# Patient Record
Sex: Male | Born: 1955 | Race: Black or African American | Hispanic: No | Marital: Single | Smoking: Current every day smoker
Health system: Southern US, Community
[De-identification: ages and names within clinical notes are randomized; demographics above are authoritative.]

## PROBLEM LIST (undated history)

## (undated) DIAGNOSIS — M79675 Pain in left toe(s): Secondary | ICD-10-CM

## (undated) DIAGNOSIS — E7800 Pure hypercholesterolemia, unspecified: Secondary | ICD-10-CM

## (undated) DIAGNOSIS — B351 Tinea unguium: Secondary | ICD-10-CM

## (undated) HISTORY — DX: Tinea unguium: B35.1

## (undated) HISTORY — DX: Tinea unguium: M79.675

## (undated) HISTORY — DX: Pure hypercholesterolemia, unspecified: E78.00

---
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
## 2000-06-23 ENCOUNTER — Encounter: Payer: Self-pay | Admitting: Neurology

## 2000-06-23 ENCOUNTER — Ambulatory Visit (HOSPITAL_COMMUNITY): Admission: RE | Admit: 2000-06-23 | Discharge: 2000-06-23 | Payer: Self-pay | Admitting: Neurology

## 2000-08-18 ENCOUNTER — Inpatient Hospital Stay (HOSPITAL_COMMUNITY): Admission: EM | Admit: 2000-08-18 | Discharge: 2000-09-02 | Payer: Self-pay | Admitting: Emergency Medicine

## 2000-08-18 ENCOUNTER — Encounter: Payer: Self-pay | Admitting: Emergency Medicine

## 2000-08-19 ENCOUNTER — Encounter: Admission: RE | Admit: 2000-08-19 | Discharge: 2000-08-19 | Payer: Self-pay

## 2000-12-19 ENCOUNTER — Ambulatory Visit (HOSPITAL_COMMUNITY): Admission: RE | Admit: 2000-12-19 | Discharge: 2000-12-19 | Payer: Self-pay | Admitting: Neurology

## 2001-08-17 ENCOUNTER — Ambulatory Visit (HOSPITAL_COMMUNITY): Admission: RE | Admit: 2001-08-17 | Discharge: 2001-08-17 | Payer: Self-pay | Admitting: Neurology

## 2003-01-10 ENCOUNTER — Encounter: Payer: Self-pay | Admitting: Emergency Medicine

## 2003-01-10 ENCOUNTER — Inpatient Hospital Stay (HOSPITAL_COMMUNITY): Admission: EM | Admit: 2003-01-10 | Discharge: 2003-01-15 | Payer: Self-pay | Admitting: Emergency Medicine

## 2019-05-18 DIAGNOSIS — F17200 Nicotine dependence, unspecified, uncomplicated: Secondary | ICD-10-CM | POA: Insufficient documentation

## 2019-05-18 DIAGNOSIS — R7302 Impaired glucose tolerance (oral): Secondary | ICD-10-CM | POA: Insufficient documentation

## 2019-05-18 DIAGNOSIS — E785 Hyperlipidemia, unspecified: Secondary | ICD-10-CM | POA: Insufficient documentation

## 2019-05-25 ENCOUNTER — Ambulatory Visit: Payer: Self-pay | Admitting: Podiatry

## 2019-06-01 ENCOUNTER — Ambulatory Visit (INDEPENDENT_AMBULATORY_CARE_PROVIDER_SITE_OTHER): Payer: Medicaid Other | Admitting: Podiatry

## 2019-06-01 ENCOUNTER — Other Ambulatory Visit: Payer: Self-pay

## 2019-06-01 ENCOUNTER — Encounter: Payer: Self-pay | Admitting: Podiatry

## 2019-06-01 VITALS — BP 120/64 | HR 59 | Temp 97.9°F

## 2019-06-01 DIAGNOSIS — M79674 Pain in right toe(s): Secondary | ICD-10-CM | POA: Diagnosis not present

## 2019-06-01 DIAGNOSIS — B351 Tinea unguium: Secondary | ICD-10-CM | POA: Insufficient documentation

## 2019-06-01 DIAGNOSIS — M79675 Pain in left toe(s): Secondary | ICD-10-CM | POA: Diagnosis not present

## 2019-06-01 HISTORY — DX: Tinea unguium: B35.1

## 2019-06-01 NOTE — Patient Instructions (Signed)

## 2019-06-03 ENCOUNTER — Encounter: Payer: Self-pay | Admitting: Podiatry

## 2019-06-03 NOTE — Progress Notes (Signed)
Subjective: Lawrence Robertson presents today referred bywith cc of painful, discolored, thick toenails greater than several months which interfere with daily activities.  Pain is aggravated when wearing enclosed shoe gear.   Past Medical History:  Diagnosis Date  . Hypercholesterolemia   . Onychomycosis 06/01/2019     History reviewed. No pertinent surgical history.    Current Outpatient Medications:  .  simvastatin (ZOCOR) 20 MG tablet, TK 1 T PO  D IN THE EVENING, Disp: , Rfl:  .  hydrochlorothiazide (HYDRODIURIL) 25 MG tablet, , Disp: , Rfl:  .  triamcinolone ointment (KENALOG) 0.5 %, , Disp: , Rfl:    No Known Allergies   Social History   Occupational History  . Not on file  Tobacco Use  . Smoking status: Current Every Day Smoker    Packs/day: 0.25  . Smokeless tobacco: Never Used  Substance and Sexual Activity  . Alcohol use: Not on file  . Drug use: Not on file  . Sexual activity: Not on file     History reviewed. No pertinent family history.    There is no immunization history on file for this patient.   Review of systems: Positive Findings in bold print.  Constitutional:  chills, fatigue, fever, sweats, weight change Communication: Optometrist, sign Ecologist, hand writing, iPad/Android device Head: headaches, head injury Eyes: changes in vision, eye pain, glaucoma, cataracts, macular degeneration, diplopia, glare,  light sensitivity, eyeglasses or contacts, blindness Ears nose mouth throat: hearing impaired, hearing aids,  ringing in ears, deaf, sign language,  vertigo,   nosebleeds,  rhinitis,  cold sores, snoring, swollen glands Cardiovascular: HTN, edema, arrhythmia, pacemaker in place, defibrillator in place, chest pain/tightness, chronic anticoagulation, blood clot, heart failure, MI Peripheral Vascular: leg cramps, varicose veins, blood clots, lymphedema, varicosities Respiratory:  difficulty breathing, denies congestion, SOB, wheezing, cough,  emphysema Gastrointestinal: change in appetite or weight, abdominal pain, constipation, diarrhea, nausea, vomiting, vomiting blood, change in bowel habits, abdominal pain, jaundice, rectal bleeding, hemorrhoids, GERD Genitourinary:  nocturia,  pain on urination, polyuria,  blood in urine, Foley catheter, urinary urgency, ESRD on hemodialysis Musculoskeletal: amputation, cramping, stiff joints, painful joints, decreased joint motion, fractures, OA, gout, hemiplegia, paraplegia, uses cane, wheelchair bound, uses walker, uses rollator Skin: +changes in toenails, color change, dryness, itching, mole changes,  rash, wound(s) Neurological: headaches, numbness in feet, paresthesias in feet, burning in feet, fainting,  seizures, change in speech. denies headaches, memory problems/poor historian, cerebral palsy, weakness, paralysis, CVA, TIA Endocrine: diabetes, hypothyroidism, hyperthyroidism,  goiter, dry mouth, flushing, heat intolerance,  cold intolerance,  excessive thirst, denies polyuria,  nocturia Hematological:  easy bleeding, excessive bleeding, easy bruising, enlarged lymph nodes, on long term blood thinner, history of past transusions Allergy/immunological:  hives, eczema, frequent infections, multiple drug allergies, seasonal allergies, transplant recipient, multiple food allergies Psychiatric:  anxiety, depression, mood disorder, suicidal ideations, hallucinations, insomnia  Objective: Vitals:   06/01/19 0857  BP: 120/64  Pulse: (!) 59  Temp: 97.9 F (36.6 C)    Vascular Examination: Capillary refill time <3 seconds x 10 digits.  Dorsalis pedis pulses 2/4 b/l.   Posterior tibial pulses 2/4 b/l.   Sparse digital hair x 10 digits.  Skin temperature gradient WNL b/l.  Dermatological Examination: Skin with normal turgor, texture and tone b/l.  Toenails 2-5 b/l discolored, thick, dystrophic with subungual debris and pain with palpation to nailbeds due to thickness of  nails.  Onychogryphosis b/l great toes with toenails overgrown and growing in a lateral direction overlying  the dorsal aspect of both 2nd digits.  There is pain with palpation to nailbeds due to thickness of nails.  Hyperkeratotic lesion submet head 1 left foot and hallux right foot with tenderness to palpation. No edema, no erythema, no drainage, no flocculence.  Musculoskeletal: Muscle strength 5/5 to all LE muscle groups.  Neurological: Sensation intact 5/5 b/l with 10 gram monofilament.  Vibratory sensation intact b/l.  Assessment: 1. Painful onychomycosis toenails 1-5 b/l   Plan: 1. Discussed onychomycosis and treatment options.  Literature dispensed on today. 2. Toenails 1-5 b/l were debrided in length and girth without iatrogenic bleeding. 3. Advised patient to use pumice stone on calluses once weekly for maintenance. 4. Patient to continue soft, supportive shoe gear daily. 5. Patient to report any pedal injuries to medical professional immediately. 6. Follow up 3 months.  7. Patient/POA to call should there be a concern in the interim.

## 2019-08-31 ENCOUNTER — Ambulatory Visit: Payer: Medicaid Other | Admitting: Podiatry

## 2019-09-04 ENCOUNTER — Ambulatory Visit: Payer: Medicaid Other | Admitting: Podiatry

## 2020-07-18 DIAGNOSIS — M25569 Pain in unspecified knee: Secondary | ICD-10-CM | POA: Insufficient documentation

## 2020-08-18 ENCOUNTER — Ambulatory Visit: Payer: Medicaid Other | Admitting: Podiatry

## 2020-11-21 ENCOUNTER — Encounter: Payer: Self-pay | Admitting: Podiatry

## 2020-11-21 ENCOUNTER — Other Ambulatory Visit: Payer: Self-pay

## 2020-11-21 ENCOUNTER — Ambulatory Visit (INDEPENDENT_AMBULATORY_CARE_PROVIDER_SITE_OTHER): Payer: Medicaid Other | Admitting: Podiatry

## 2020-11-21 DIAGNOSIS — B351 Tinea unguium: Secondary | ICD-10-CM

## 2020-11-21 DIAGNOSIS — M79675 Pain in left toe(s): Secondary | ICD-10-CM | POA: Diagnosis not present

## 2020-11-21 DIAGNOSIS — L602 Onychogryphosis: Secondary | ICD-10-CM | POA: Diagnosis not present

## 2020-11-21 DIAGNOSIS — M79674 Pain in right toe(s): Secondary | ICD-10-CM | POA: Diagnosis not present

## 2020-11-21 NOTE — Progress Notes (Signed)
  Subjective:  Patient ID: Lawrence Robertson, male    DOB: 1956/06/26,  MRN: [id number]  Lawrence Robertson presents to clinic today for painful thick toenails that are difficult to trim. Pain interferes with ambulation. Aggravating factors include wearing enclosed shoe gear. Pain is relieved with periodic professional debridement.   He states both great toes are painful on today's visit. It has been over a year since his last visit to our clinic.  Review of Systems: Negative except as noted in the HPI. Past Medical History:  Diagnosis Date  . Hypercholesterolemia   . Onychomycosis 06/01/2019   History reviewed. No pertinent surgical history.  Current Outpatient Medications:  .  atorvastatin (LIPITOR) 40 MG tablet, Take 40 mg by mouth at bedtime., Disp: , Rfl:  .  diclofenac Sodium (VOLTAREN) 1 % GEL, Apply 4 g topically 4 (four) times daily., Disp: , Rfl:  .  hydrochlorothiazide (HYDRODIURIL) 25 MG tablet, , Disp: , Rfl:  .  simvastatin (ZOCOR) 20 MG tablet, TK 1 T PO  D IN THE EVENING (Patient not taking: Reported on 11/21/2020), Disp: , Rfl:  .  triamcinolone ointment (KENALOG) 0.5 %, , Disp: , Rfl:  No Known Allergies Social History   Occupational History  . Not on file  Tobacco Use  . Smoking status: Current Every Day Smoker    Packs/day: 0.25  . Smokeless tobacco: Never Used  Substance and Sexual Activity  . Alcohol use: Not on file  . Drug use: Not on file  . Sexual activity: Not on file    Objective:   Constitutional Lawrence Robertson is a pleasant 65 y.o. Caucasian male, WD, WN in NAD. AAO x 3.   Vascular Capillary fill time to digits <3 seconds b/l lower extremities. Palpable pedal pulses b/l LE. Pedal hair sparse. Lower extremity skin temperature gradient within normal limits. No cyanosis or clubbing noted.  Neurologic Normal speech. Oriented to person, place, and time. Protective sensation intact 5/5 intact bilaterally with 10g monofilament b/l. Vibratory sensation intact b/l.   Dermatologic Pedal skin with normal turgor, texture and tone bilaterally. No open wounds bilaterally. No interdigital macerations bilaterally. Toenails 1-5 b/l elongated, discolored, dystrophic, thickened, crumbly with subungual debris and tenderness to dorsal palpation. Onychogryphosis of b/l great toes with tenderness to palpation. Nailplates growing in dorsolateral direction. No erythema, no edema, no drainage, no fluctuance. No hyperkeratotic nor porokeratotic lesions present on today's visit.  Orthopedic: Normal muscle strength 5/5 to all lower extremity muscle groups bilaterally. No pain crepitus or joint limitation noted with ROM b/l. Hallux valgus with bunion deformity noted b/l lower extremities. Patient ambulates independent of any assistive aids.   Radiographs: None Assessment:   1. Pain due to onychomycosis of toenails of both feet   2. Onychogryphosis   3. Pain in toes of both feet    Plan:  Patient was evaluated and treated and all questions answered.  Onychomycosis with pain -Nails palliatively debridement as below -Educated on self-care  Procedure: Nail Debridement Rationale: Pain Type of Debridement: manual, sharp debridement. Instrumentation: Nail nipper, rotary burr. Number of Nails: 10 -Examined patient. -No new findings. No new orders. -Patient to continue soft, supportive shoe gear daily. -Toenails 1-5 b/l were debrided in length and girth with sterile nail nippers and dremel without iatrogenic bleeding.  -Patient to report any pedal injuries to medical professional immediately. -Patient/POA to call should there be question/concern in the interim.  Return in about 3 months (around 02/19/2021).  Marzetta Board, DPM

## 2021-03-06 ENCOUNTER — Other Ambulatory Visit: Payer: Self-pay

## 2021-03-06 ENCOUNTER — Ambulatory Visit: Payer: Medicaid Other | Admitting: Podiatry

## 2021-03-06 ENCOUNTER — Encounter: Payer: Self-pay | Admitting: Podiatry

## 2021-03-06 DIAGNOSIS — M79674 Pain in right toe(s): Secondary | ICD-10-CM

## 2021-03-06 DIAGNOSIS — M79675 Pain in left toe(s): Secondary | ICD-10-CM | POA: Diagnosis not present

## 2021-03-06 DIAGNOSIS — B351 Tinea unguium: Secondary | ICD-10-CM

## 2021-03-06 NOTE — Progress Notes (Signed)
Subjective: Lawrence Robertson is a pleasant 65 y.o. male patient seen today painful thick toenails that are difficult to trim. Pain interferes with ambulation. Aggravating factors include wearing enclosed shoe gear. Pain is relieved with periodic professional debridement.  He is accompanied by his sister on today's visit.  They voice no new pedal problems on today's visit.  No Known Allergies  Objective: Physical Exam  General: Lawrence Robertson is a pleasant 65 y.o. African American male, in NAD. AAO x 3.   Vascular:  Capillary refill time to digits <3 seconds b/l. Palpable pedal pulses b/l LE. Pedal hair sparse b/l lower extremities. Lower extremity skin temperature gradient within normal limits. No pain with calf compression b/l. No edema noted b/l lower extremities.   Dermatological:  Pedal skin with normal turgor, texture and tone bilaterally. No open wounds bilaterally. No interdigital macerations bilaterally. Toenails 1-5 b/l elongated, discolored, dystrophic, thickened, crumbly with subungual debris and tenderness to dorsal palpation. No hyperkeratotic nor porokeratotic lesions present on today's visit.  Musculoskeletal:  Normal muscle strength 5/5 to all lower extremity muscle groups bilaterally. No pain crepitus or joint limitation noted with ROM b/l. Hallux valgus with bunion deformity noted b/l lower extremities. Patient ambulates independent of any assistive aids.  Neurological:  Protective sensation intact 5/5 intact bilaterally with 10g monofilament b/l. Vibratory sensation intact b/l. Clonus negative b/l.  Assessment and Plan:  1. Pain due to onychomycosis of toenails of both feet     -Examined patient. -No new findings. No new orders. -Patient to continue soft, supportive shoe gear daily. -Toenails 1-5 b/l were debrided in length and girth with sterile nail nippers and dremel without iatrogenic bleeding.  -Patient to report any pedal injuries to medical professional  immediately. -Patient/POA to call should there be question/concern in the interim.  Return in about 3 months (around 06/06/2021).  Marzetta Board, DPM

## 2021-06-12 ENCOUNTER — Other Ambulatory Visit: Payer: Self-pay

## 2021-06-12 ENCOUNTER — Ambulatory Visit (INDEPENDENT_AMBULATORY_CARE_PROVIDER_SITE_OTHER): Payer: Medicaid Other | Admitting: Podiatry

## 2021-06-12 ENCOUNTER — Encounter: Payer: Self-pay | Admitting: Podiatry

## 2021-06-12 DIAGNOSIS — M79675 Pain in left toe(s): Secondary | ICD-10-CM

## 2021-06-12 DIAGNOSIS — B351 Tinea unguium: Secondary | ICD-10-CM | POA: Diagnosis not present

## 2021-06-12 DIAGNOSIS — M79674 Pain in right toe(s): Secondary | ICD-10-CM | POA: Diagnosis not present

## 2021-06-14 NOTE — Progress Notes (Signed)
Subjective: Lawrence Robertson is a pleasant 65 y.o. male patient seen today for painful thick toenails that are difficult to trim. Pain interferes with ambulation. Aggravating factors include wearing enclosed shoe gear. Pain is relieved with periodic professional debridement.   His sister is present during today's visit. They voice no new pedal concerns on today'  No Known Allergies  Objective: Physical Exam  General: Lawrence Robertson is a pleasant 65 y.o. African American male, WD, WN in NAD. AAO x 3.   Vascular:  Capillary fill time to digits <3 seconds b/l lower extremities. Palpable DP pulse(s) b/l lower extremities Palpable PT pulse(s) b/l lower extremities Pedal hair sparse. Lower extremity skin temperature gradient within normal limits. No pain with calf compression b/l. No edema noted b/l lower extremities.  Dermatological:  Pedal skin with normal turgor, texture and tone b/l lower extremities. No open wounds b/l lower extremities. No interdigital macerations b/l lower extremities. Toenails 1-5 b/l elongated, discolored, dystrophic, thickened, crumbly with subungual debris and tenderness to dorsal palpation.  Musculoskeletal:  Normal muscle strength 5/5 to all lower extremity muscle groups bilaterally. No pain crepitus or joint limitation noted with ROM b/l lower extremities. Hallux valgus with bunion deformity noted b/l lower extremities. Patient ambulates independent of any assistive aids.  Neurological:  Protective sensation intact 5/5 intact bilaterally with 10g monofilament b/l. Vibratory sensation intact b/l. Proprioception intact bilaterally.  Assessment and Plan:  1. Pain due to onychomycosis of toenails of both feet      -Patient to continue soft, supportive shoe gear daily. -Toenails 1-5 b/l were debrided in length and girth with sterile nail nippers and dremel without iatrogenic bleeding.  -Patient to report any pedal injuries to medical professional  immediately. -Patient/POA to call should there be question/concern in the interim.  Return in about 3 months (around 09/12/2021).  Marzetta Board, DPM

## 2021-08-07 DIAGNOSIS — Z720 Tobacco use: Secondary | ICD-10-CM | POA: Diagnosis not present

## 2021-08-07 DIAGNOSIS — Z7251 High risk heterosexual behavior: Secondary | ICD-10-CM | POA: Diagnosis not present

## 2021-08-07 DIAGNOSIS — E785 Hyperlipidemia, unspecified: Secondary | ICD-10-CM | POA: Diagnosis not present

## 2021-08-07 DIAGNOSIS — Z125 Encounter for screening for malignant neoplasm of prostate: Secondary | ICD-10-CM | POA: Diagnosis not present

## 2021-08-07 DIAGNOSIS — Z0000 Encounter for general adult medical examination without abnormal findings: Secondary | ICD-10-CM | POA: Diagnosis not present

## 2021-08-19 ENCOUNTER — Other Ambulatory Visit: Payer: Self-pay | Admitting: Nurse Practitioner

## 2021-08-19 DIAGNOSIS — Z720 Tobacco use: Secondary | ICD-10-CM

## 2021-08-21 ENCOUNTER — Other Ambulatory Visit: Payer: Self-pay

## 2021-08-21 ENCOUNTER — Ambulatory Visit (INDEPENDENT_AMBULATORY_CARE_PROVIDER_SITE_OTHER): Payer: Self-pay | Admitting: Internal Medicine

## 2021-08-21 ENCOUNTER — Other Ambulatory Visit (HOSPITAL_COMMUNITY)
Admission: RE | Admit: 2021-08-21 | Discharge: 2021-08-21 | Disposition: A | Discharge status: Home / Self Care | Payer: Medicaid Other | Source: Ambulatory Visit | Attending: Internal Medicine | Admitting: Internal Medicine

## 2021-08-21 ENCOUNTER — Encounter: Payer: Self-pay | Admitting: Internal Medicine

## 2021-08-21 VITALS — BP 162/88 | HR 46 | Temp 98.2°F | Wt 176.0 lb

## 2021-08-21 DIAGNOSIS — A539 Syphilis, unspecified: Secondary | ICD-10-CM

## 2021-08-21 NOTE — Progress Notes (Signed)
Patient: Lawrence Robertson  DOB: 06/30/1956 MRN: [id number] PCP: No primary care provider on file.    Chief Complaint  Patient presents with   New Patient (Initial Visit)    Syphilis      Patient Active Problem List   Diagnosis Date Noted   Onychomycosis 06/01/2019     Subjective:  Lawrence Robertson is a 65 y.o. Male with chronic knee pain, HLD, tobacco abuse presents for syphilis referred by PCP. He was seen by Maxwell Marion, NP at [facility] as was tested for syphilis as part of routine screening. RPR returned 1:2, pt started on bicillin 2.4 MU weekly x 3 weeks for late latent syphilis and referred to ID for further management. Sister is present during the visit and provides much of the history. She reprots about 10-15 years ago(cannot recall exactly) he was hospitalized for syphilis. 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 She is thinks he may have had a non-reactive syphilis test. Pt has not been sexually active for 22 years. He denies rash, fever, chills, HA, blurry vision, N,V,D.  Labs: Rpr 1:2 on 08/07/21 Hiv ab/p24 negative on  08/07/21  Review of Systems  Constitutional:  Negative for chills and fever.  HENT:  Negative for congestion, hearing loss, sinus pain and tinnitus.   Eyes:  Negative for blurred vision and double vision.  Respiratory: Negative.    Cardiovascular: Negative.   Gastrointestinal: Negative.   Genitourinary: Negative.   Musculoskeletal: Negative.   Skin: Negative.   Neurological:  Negative for headaches.  Endo/Heme/Allergies: Negative.   Psychiatric/Behavioral: Negative.     Past Medical History:  Diagnosis Date   Hypercholesterolemia    Onychomycosis 06/01/2019    Outpatient Medications Prior to Visit  Medication Sig Dispense Refill   atorvastatin (LIPITOR) 40 MG tablet Take 40 mg by mouth at bedtime.     diclofenac Sodium (VOLTAREN) 1 % GEL Apply 4 g topically 4 (four)  times daily.     hydrochlorothiazide (HYDRODIURIL) 25 MG tablet      simvastatin (ZOCOR) 20 MG tablet      triamcinolone ointment (KENALOG) 0.5 %      No facility-administered medications prior to visit.     No Known Allergies  Social History   Tobacco Use   Smoking status: Every Day    Packs/day: 0.25    Types: Cigarettes   Smokeless tobacco: Never  Substance Use Topics   Alcohol use: Not Currently   Drug use: Not Currently    Types: Marijuana    Comment: 30 years ago    No family history on file.  Objective:   Vitals:   08/21/21 1119  Weight: 79.8 kg   There is no height or weight on file to calculate BMI.  Physical Exam Constitutional:      General: He is not in acute distress.    Appearance: He is normal weight. He is not toxic-appearing.  HENT:     Head: Normocephalic and atraumatic.     Right Ear: External ear normal.     Left Ear: External ear normal.     Nose: No congestion or rhinorrhea.     Mouth/Throat:     Mouth: Mucous membranes are moist.     Pharynx: Oropharynx is clear.  Eyes:     Extraocular Movements: Extraocular movements intact.     Conjunctiva/sclera: Conjunctivae normal.     Pupils: Pupils are equal, round, and reactive  to light.  Cardiovascular:     Rate and Rhythm: Normal rate and regular rhythm.     Heart sounds: No murmur heard.   No friction rub. No gallop.  Pulmonary:     Effort: Pulmonary effort is normal.     Breath sounds: Normal breath sounds.  Abdominal:     General: Abdomen is flat. Bowel sounds are normal.     Palpations: Abdomen is soft.  Musculoskeletal:        General: No swelling. Normal range of motion.     Cervical back: Normal range of motion and neck supple.  Skin:    General: Skin is warm and dry.  Neurological:     General: No focal deficit present.     Mental Status: He is oriented to person, place, and time.  Psychiatric:        Mood and Affect: Mood normal.    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 This coincides with hospitalization 10/25-11/06/2020 -June 2002, RPR 1:64-no treatment recorded -August 07, 2021: 1:2- bicillin 2.4 MU x1 -By 13 months RPR titer should have decreased by 4 fold to reflect treatment success. We do not have  RPR 13 months from neurosyphilis treatment. At this point RPR is 1:2. My  concern is there was a possible non reactive RPR(reported by sister)  since neurosyphilis treatment. As such will err on the side of caution and treat for late latent syphilis(re-infection).   Plan: -[facility] to fax RPR results and treatment history -Agree with bicillin 2.4 MU weekly x3 weekly (pt is scheduled to complete series at PCP). He received first dose today -GC oral and urine testing today in clinic -Follow-up PRN  Laurice Record, MD [facility]   08/21/21  11:22 AM

## 2021-08-21 NOTE — Patient Instructions (Signed)
Complete PEN weekly x3 Follow-up PRN

## 2021-08-24 LAB — CYTOLOGY, (ORAL, ANAL, URETHRAL) ANCILLARY ONLY
Chlamydia: NEGATIVE
Comment: NEGATIVE
Comment: NORMAL
Neisseria Gonorrhea: NEGATIVE

## 2021-08-24 LAB — URINE CYTOLOGY ANCILLARY ONLY
Chlamydia: NEGATIVE
Comment: NEGATIVE
Comment: NORMAL
Neisseria Gonorrhea: NEGATIVE

## 2021-08-25 ENCOUNTER — Telehealth: Payer: Self-pay

## 2021-08-25 NOTE — Telephone Encounter (Signed)
I attempted to reach out to the patient regarding lab results. Patient did not answer and voicemail is full. Lawrence Robertson

## 2021-08-25 NOTE — Telephone Encounter (Signed)
-----   Message from Laurice Record, MD sent at 08/25/2021 10:07 AM EDT ----- Testing negative for gonorrhea and chlamydia. No further intervention needed.

## 2021-08-26 NOTE — Telephone Encounter (Signed)
Called patient to relay lab results, patient's sister answered. Asked that she please have patient call his doctor's office. Did not disclose any personal or health information.   Beryle Flock, RN

## 2021-08-31 NOTE — Telephone Encounter (Signed)
Third attempt to contact patient, no answer. Left HIPAA compliant voicemail requesting callback.   Beryle Flock, RN

## 2021-09-11 ENCOUNTER — Other Ambulatory Visit: Payer: Self-pay

## 2021-09-11 ENCOUNTER — Ambulatory Visit
Admission: RE | Admit: 2021-09-11 | Discharge: 2021-09-11 | Disposition: A | Discharge status: Home / Self Care | Payer: Medicaid Other | Source: Ambulatory Visit | Attending: Nurse Practitioner | Admitting: Nurse Practitioner

## 2021-09-11 DIAGNOSIS — Z720 Tobacco use: Secondary | ICD-10-CM

## 2021-09-25 ENCOUNTER — Ambulatory Visit: Payer: Medicaid Other | Admitting: Podiatry

## 2021-09-25 ENCOUNTER — Other Ambulatory Visit: Payer: Self-pay

## 2021-09-25 ENCOUNTER — Encounter: Payer: Self-pay | Admitting: Podiatry

## 2021-09-25 DIAGNOSIS — M79675 Pain in left toe(s): Secondary | ICD-10-CM

## 2021-09-25 DIAGNOSIS — M79674 Pain in right toe(s): Secondary | ICD-10-CM | POA: Diagnosis not present

## 2021-09-25 DIAGNOSIS — B351 Tinea unguium: Secondary | ICD-10-CM

## 2021-09-29 NOTE — Progress Notes (Signed)
  Subjective:  Patient ID: Lawrence Robertson, male    DOB: 01-08-1956,  MRN: [id number]  [name] presents to clinic today for painful elongated mycotic toenails 1-5 bilaterally which are tender when wearing enclosed shoe gear. Pain is relieved with periodic professional debridement.  Patient is accompanied by her sister, Cherlyn Cushing, on today's visit.  No Known Allergies  Review of Systems: Negative except as noted in the HPI. Objective:   Constitutional LUVERN MCISAAC is a pleasant 65 y.o. African American male, in NAD. AAO x 3.   Vascular CFT <3 seconds b/l LE. Palpable DP/PT pulses b/l LE. Digital hair sparse b/l. Skin temperature gradient WNL b/l. No pain with calf compression b/l. No edema noted b/l. No cyanosis or clubbing noted b/l LE.  Neurologic Normal speech. Oriented to person, place, and time. Protective sensation intact 5/5 intact bilaterally with 10g monofilament b/l. Vibratory sensation intact b/l. Proprioception intact bilaterally.  Dermatologic Pedal skin warm and supple b/l.  No open wounds b/l. No interdigital macerations. Toenails 1-5 b/l elongated, thickened, discolored with subungual debris. +Tenderness with dorsal palpation of nailplates.No hyperkeratotic nor porokeratotic lesions noted b/l.  Orthopedic: Normal muscle strength 5/5 to all lower extremity muscle groups bilaterally. HAV with bunion deformity noted b/l LE.Marland Kitchen No pain, crepitus or joint limitation noted with ROM b/l LE.  Patient ambulates independently without assistive aids.   Radiographs: None  Assessment:   1. Pain due to onychomycosis of toenails of both feet    Plan:  Patient was evaluated and treated and all questions answered. Consent given for treatment as described below: -Examined patient. -Patient to continue soft, supportive shoe gear daily. -Mycotic toenails 1-5 bilaterally were debrided in length and girth with sterile nail nippers and dremel without incident. -Patient/POA to call  should there be question/concern in the interim.  Return in about 3 months (around 12/24/2021).  Marzetta Board, DPM

## 2021-10-02 DIAGNOSIS — Z23 Encounter for immunization: Secondary | ICD-10-CM | POA: Diagnosis not present

## 2022-01-01 ENCOUNTER — Other Ambulatory Visit: Payer: Self-pay

## 2022-01-01 ENCOUNTER — Ambulatory Visit: Payer: Medicaid Other | Admitting: Podiatry

## 2022-01-01 ENCOUNTER — Encounter: Payer: Self-pay | Admitting: Podiatry

## 2022-01-01 DIAGNOSIS — M79675 Pain in left toe(s): Secondary | ICD-10-CM | POA: Diagnosis not present

## 2022-01-01 DIAGNOSIS — M79674 Pain in right toe(s): Secondary | ICD-10-CM | POA: Diagnosis not present

## 2022-01-01 DIAGNOSIS — B351 Tinea unguium: Secondary | ICD-10-CM

## 2022-01-09 NOTE — Progress Notes (Signed)
?  Subjective:  ?Patient ID: Lawrence Robertson, male    DOB: 12/06/1955,  MRN: [id number] ? ?Lawrence Robertson presents to clinic today for thick, elongated toenails b/l lower extremities which are tender when wearing enclosed shoe gear. ? ?New problem(s): None.  ? ?PCP is Delford Field, FNP , and last visit was September, 2022, per patient recall. ? ?No Known Allergies ? ?Review of Systems: Negative except as noted in the HPI. ? ?Objective: No changes noted in today's physical examination. ?Objective:  ? ?Constitutional Lawrence Robertson is a pleasant 66 y.o. African American male, in NAD. AAO x 3.   ?Vascular CFT <3 seconds b/l LE. Palpable DP/PT pulses b/l LE. Digital hair sparse b/l. Skin temperature gradient WNL b/l. No pain with calf compression b/l. No edema noted b/l. No cyanosis or clubbing noted b/l LE.  ?Neurologic Normal speech. Oriented to person, place, and time. Protective sensation intact 5/5 intact bilaterally with 10g monofilament b/l. Vibratory sensation intact b/l. Proprioception intact bilaterally.  ?Dermatologic Pedal skin warm and supple b/l.  No open wounds b/l. No interdigital macerations. Toenails 1-5 b/l elongated, thickened, discolored with subungual debris. +Tenderness with dorsal palpation of nailplates.No hyperkeratotic nor porokeratotic lesions noted b/l.  ?Orthopedic: Normal muscle strength 5/5 to all lower extremity muscle groups bilaterally. HAV with bunion deformity noted b/l LE.Marland Kitchen No pain, crepitus or joint limitation noted with ROM b/l LE.  Patient ambulates independently without assistive aids.  ? ?Radiographs: None ? ?Assessment/Plan: ?1. Pain due to onychomycosis of toenails of both feet   ?  ?-Consent given for treatment as described below: ?-Toenails 1-5 b/l were debrided in length and girth with sterile nail nippers and dremel without iatrogenic bleeding.  ?-Patient/POA to call should there be question/concern in the interim.  ? ?Return in about 3 months (around  04/03/2022). ? ?Marzetta Board, DPM  ?

## 2022-02-25 DIAGNOSIS — J309 Allergic rhinitis, unspecified: Secondary | ICD-10-CM | POA: Diagnosis not present

## 2022-02-25 DIAGNOSIS — Z113 Encounter for screening for infections with a predominantly sexual mode of transmission: Secondary | ICD-10-CM | POA: Diagnosis not present

## 2022-02-26 DIAGNOSIS — Z7251 High risk heterosexual behavior: Secondary | ICD-10-CM | POA: Diagnosis not present

## 2022-04-09 ENCOUNTER — Ambulatory Visit (INDEPENDENT_AMBULATORY_CARE_PROVIDER_SITE_OTHER): Payer: Medicaid Other | Admitting: Podiatry

## 2022-04-09 DIAGNOSIS — Q809 Congenital ichthyosis, unspecified: Secondary | ICD-10-CM | POA: Insufficient documentation

## 2022-04-09 DIAGNOSIS — B351 Tinea unguium: Secondary | ICD-10-CM

## 2022-04-09 DIAGNOSIS — M79674 Pain in right toe(s): Secondary | ICD-10-CM

## 2022-04-09 DIAGNOSIS — M79675 Pain in left toe(s): Secondary | ICD-10-CM | POA: Diagnosis not present

## 2022-04-09 DIAGNOSIS — D126 Benign neoplasm of colon, unspecified: Secondary | ICD-10-CM | POA: Insufficient documentation

## 2022-04-17 ENCOUNTER — Encounter: Payer: Self-pay | Admitting: Podiatry

## 2022-07-16 ENCOUNTER — Ambulatory Visit: Payer: Medicaid Other | Admitting: Podiatry

## 2022-10-29 ENCOUNTER — Encounter: Payer: Self-pay | Admitting: Podiatry

## 2022-10-29 ENCOUNTER — Ambulatory Visit (INDEPENDENT_AMBULATORY_CARE_PROVIDER_SITE_OTHER): Payer: Medicaid Other | Admitting: Podiatry

## 2022-10-29 VITALS — BP 155/92

## 2022-10-29 DIAGNOSIS — M79675 Pain in left toe(s): Secondary | ICD-10-CM | POA: Diagnosis not present

## 2022-10-29 DIAGNOSIS — B351 Tinea unguium: Secondary | ICD-10-CM

## 2022-10-29 DIAGNOSIS — L84 Corns and callosities: Secondary | ICD-10-CM

## 2022-10-29 DIAGNOSIS — M79674 Pain in right toe(s): Secondary | ICD-10-CM

## 2022-10-29 NOTE — Progress Notes (Unsigned)
  Subjective:  Patient ID: Lawrence Robertson, male    DOB: Feb 22, 1956,  MRN: [id number]  Lawrence Robertson presents to clinic today for {jgcomplaint:23593}  Chief Complaint  Patient presents with   Nail Problem    RFC PCP-Williams,Alvesha PCP VST-2023   New problem(s): None. {jgcomplaint:23593}  PCP is Delford Field, FNP.  No Known Allergies  Review of Systems: Negative except as noted in the HPI.  Objective: No changes noted in today's physical examination. Vitals:   10/29/22 0816 10/29/22 0839  BP: (!) 152/89 (!) 155/92   Lawrence Robertson is a pleasant 67 y.o. male {jgbodyhabitus:24098} AAO x 3. Vascular CFT <3 seconds b/l LE. Palpable DP/PT pulses b/l LE. Digital hair sparse b/l. Skin temperature gradient WNL b/l. No pain with calf compression b/l. No edema noted b/l. No cyanosis or clubbing noted b/l LE.  Neurologic Normal speech. Oriented to person, place, and time. Protective sensation intact 5/5 intact bilaterally with 10g monofilament b/l. Vibratory sensation intact b/l. Proprioception intact bilaterally.  Dermatologic Pedal integument with normal turgor, texture and tone b/l LE. No open wounds b/l. No interdigital macerations b/l. Toenails 1-5 b/l elongated, thickened, discolored with subungual debris. +Tenderness with dorsal palpation of nailplates.   Hyperkeratotic lesion(s) noted IPJ of right great toe.  Orthopedic: Normal muscle strength 5/5 to all lower extremity muscle groups bilaterally. HAV with bunion deformity noted b/l LE.Marland Kitchen No pain, crepitus or joint limitation noted with ROM b/l LE.  Patient ambulates independently without assistive aids.   Radiographs: None  Assessment/Plan: 1. Pain due to onychomycosis of toenails of both feet   2. Callus     No orders of the defined types were placed in this encounter.   None -Patient's family member present. All questions/concerns addressed on today's visit. -Examined patient. -Medicaid ABN signed for this year. Patient  consents for services of callus debridement today. Copy has been placed in patient chart. -Toenails 1-5 b/l were debrided in length and girth with sterile nail nippers and dremel without iatrogenic bleeding.  -Callus(es) medial IPJ of right great toe pared utilizing sharp debridement with sterile blade without complication or incident. Total number debrided =1. -Patient/POA to call should there be question/concern in the interim.   Return in about 3 months (around 01/28/2023).  Marzetta Board, DPM

## 2022-12-17 DIAGNOSIS — Z86010 Personal history of colonic polyps: Secondary | ICD-10-CM | POA: Diagnosis not present

## 2022-12-17 DIAGNOSIS — K648 Other hemorrhoids: Secondary | ICD-10-CM | POA: Diagnosis not present

## 2022-12-17 DIAGNOSIS — Z09 Encounter for follow-up examination after completed treatment for conditions other than malignant neoplasm: Secondary | ICD-10-CM | POA: Diagnosis not present

## 2023-02-04 ENCOUNTER — Encounter: Payer: Self-pay | Admitting: Podiatry

## 2023-02-04 ENCOUNTER — Ambulatory Visit (INDEPENDENT_AMBULATORY_CARE_PROVIDER_SITE_OTHER): Payer: Medicaid Other | Admitting: Podiatry

## 2023-02-04 VITALS — BP 136/80

## 2023-02-04 DIAGNOSIS — M79675 Pain in left toe(s): Secondary | ICD-10-CM | POA: Diagnosis not present

## 2023-02-04 DIAGNOSIS — L84 Corns and callosities: Secondary | ICD-10-CM | POA: Diagnosis not present

## 2023-02-04 DIAGNOSIS — B351 Tinea unguium: Secondary | ICD-10-CM

## 2023-02-04 DIAGNOSIS — M79674 Pain in right toe(s): Secondary | ICD-10-CM

## 2023-02-04 NOTE — Progress Notes (Unsigned)
  Subjective:  Patient ID: Lawrence Robertson, male    DOB: [birth year],  MRN: [id number]  Lawrence Robertson presents to clinic today for {jgcomplaint:23593}  Chief Complaint  Patient presents with   Nail Problem    RFC PCP-Do not know PCP VST-6 months ago   New problem(s): None. {jgcomplaint:23593}  PCP is Sherral Hammers, FNP.  No Known Allergies  Review of Systems: Negative except as noted in the HPI.  Objective: No changes noted in today's physical examination. Vitals:   02/04/23 0801  BP: 136/80   Lawrence Robertson is a pleasant 67 y.o. male {jgbodyhabitus:24098} AAO x 3.  Vascular CFT <3 seconds b/l LE. Palpable DP/PT pulses b/l LE. Digital hair sparse b/l. Skin temperature gradient WNL b/l. No pain with calf compression b/l. No edema noted b/l. No cyanosis or clubbing noted b/l LE.  Neurologic Normal speech. Oriented to person, place, and time. Protective sensation intact 5/5 intact bilaterally with 10g monofilament b/l. Vibratory sensation intact b/l. Proprioception intact bilaterally.  Dermatologic Pedal integument with normal turgor, texture and tone b/l LE. No open wounds b/l. No interdigital macerations b/l. Toenails 1-5 b/l elongated, thickened, discolored with subungual debris. +Tenderness with dorsal palpation of nailplates.   Hyperkeratotic lesion(s) noted IPJ of right great toe.  Orthopedic: Normal muscle strength 5/5 to all lower extremity muscle groups bilaterally. HAV with bunion deformity noted b/l LE.Marland Kitchen No pain, crepitus or joint limitation noted with ROM b/l LE.  Patient ambulates independently without assistive aids.   Radiographs: None  Assessment/Plan: 1. Pain due to onychomycosis of toenails of both feet   2. Callus     No orders of the defined types were placed in this encounter.   None {Jgplan:23602::"-Patient/POA to call should there be question/concern in the interim."}   Return in about 3 months (around 05/06/2023).  Freddie Breech, DPM

## 2023-02-23 IMAGING — CT CT CHEST LUNG CANCER SCREENING LOW DOSE W/O CM
1 series · 16 of 31 positions shown, 20 images · non-contrast
Comparison: None.

CLINICAL DATA: Twenty pack-year smoking history.  Current smoker.

EXAM:
CT CHEST WITHOUT CONTRAST LOW-DOSE FOR LUNG CANCER SCREENING
TECHNIQUE: Multidetector CT imaging of the chest was performed following the
standard protocol without IV contrast.

[Series 2: ldct screening <30 bmi · axial · 0.65mm/px · z∈[-302,-26]mm · 16 of 61 slices shown, 20 images]
[im 3/61  mediastinal]
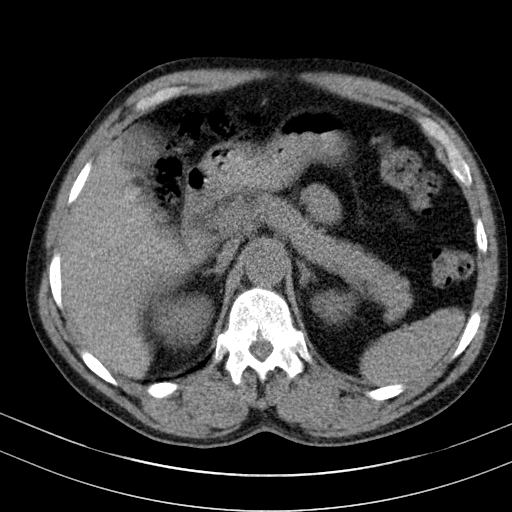
[im 3/61  lung]
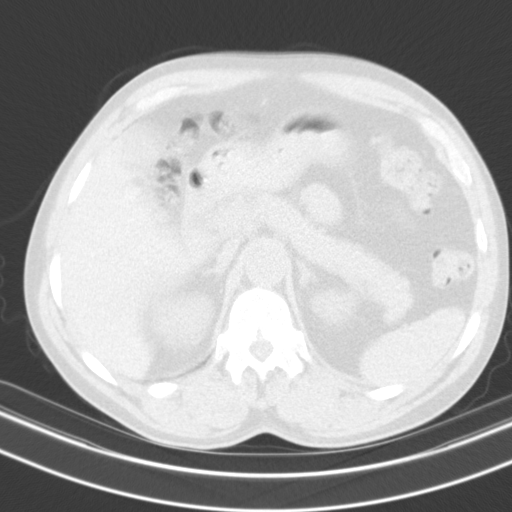
[im 7/61  lung]
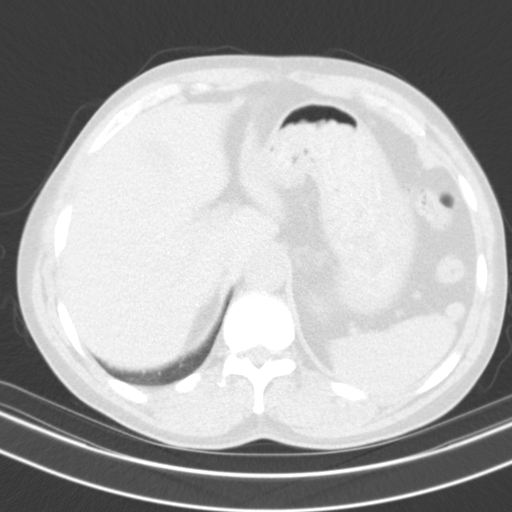
[im 12/61  lung]
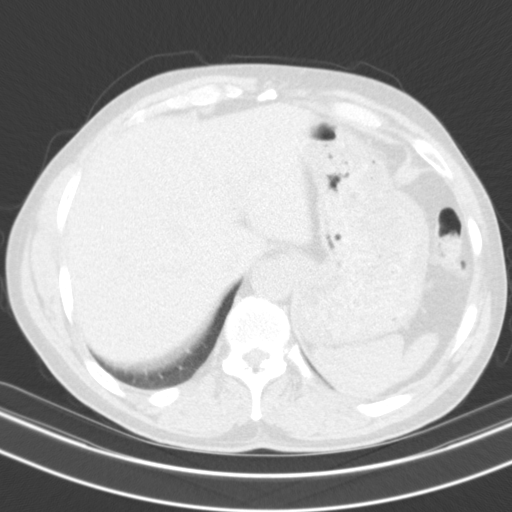
[im 14/61  lung]
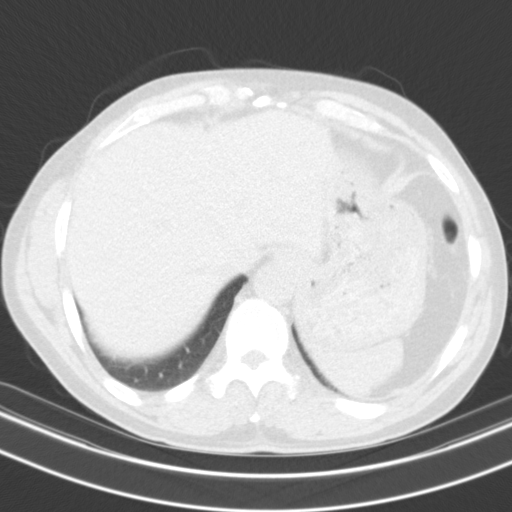
[im 18/61  mediastinal]
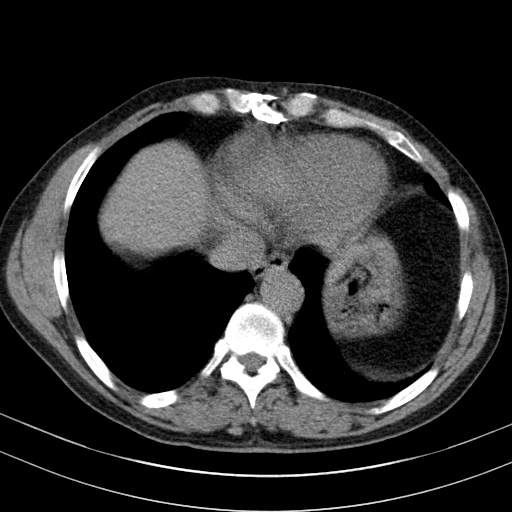
[im 18/61  lung]
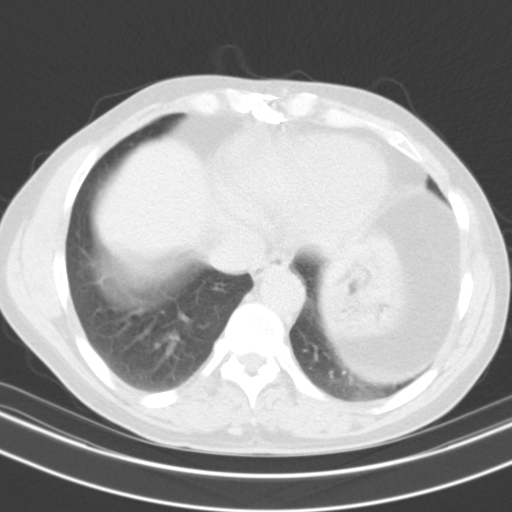
[im 21/61  lung]
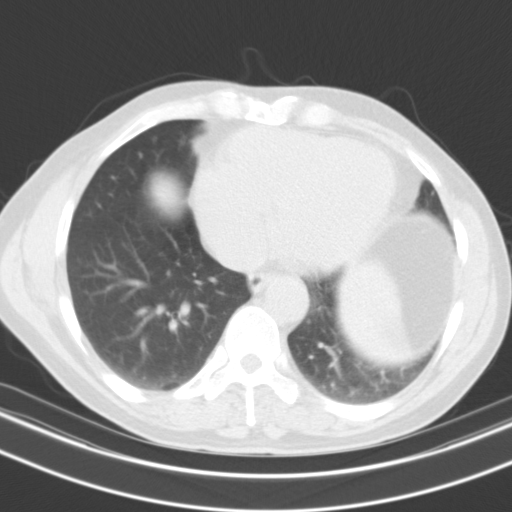
[im 25/61  lung]
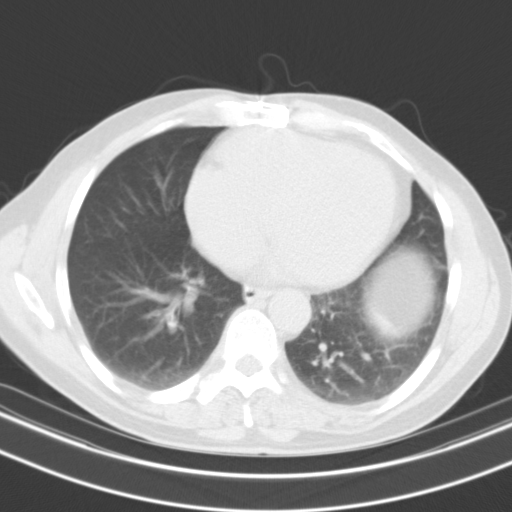
[im 29/61  lung]
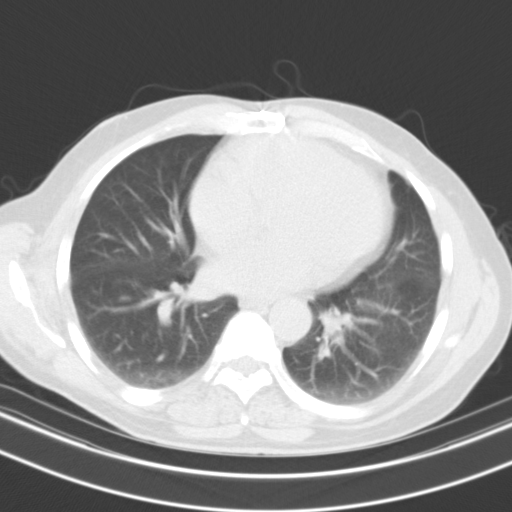
[im 33/61  mediastinal]
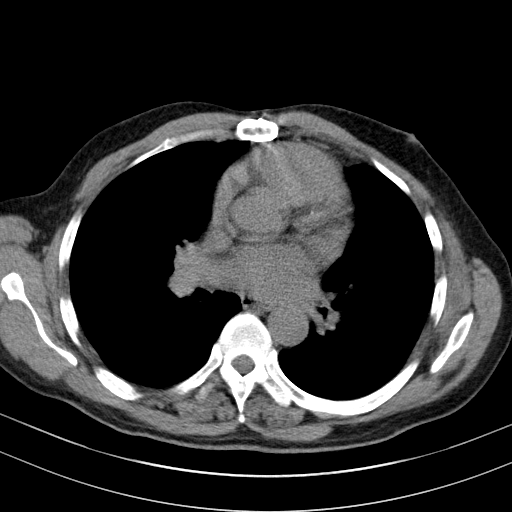
[im 33/61  lung]
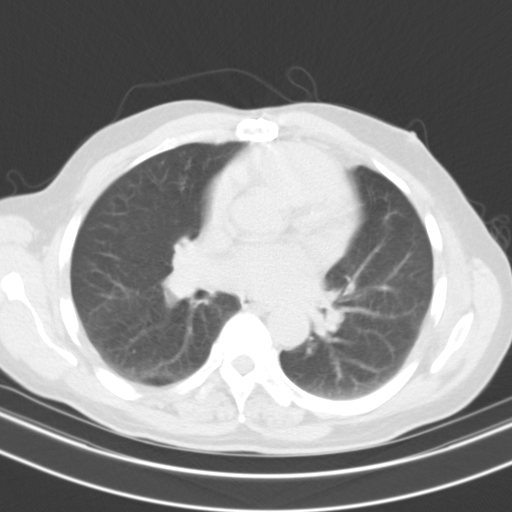
[im 36/61  lung]
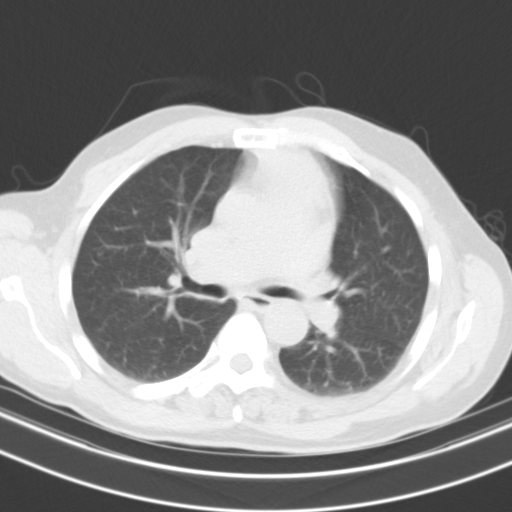
[im 38/61  lung]
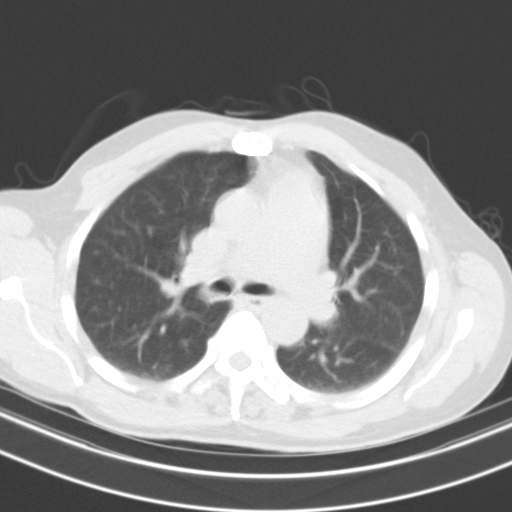
[im 41/61  lung]
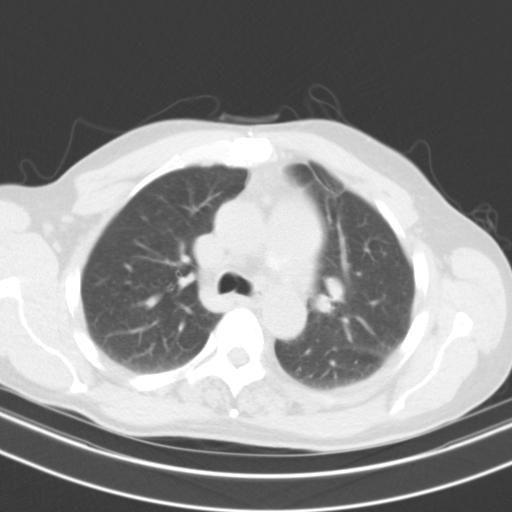
[im 45/61  mediastinal]
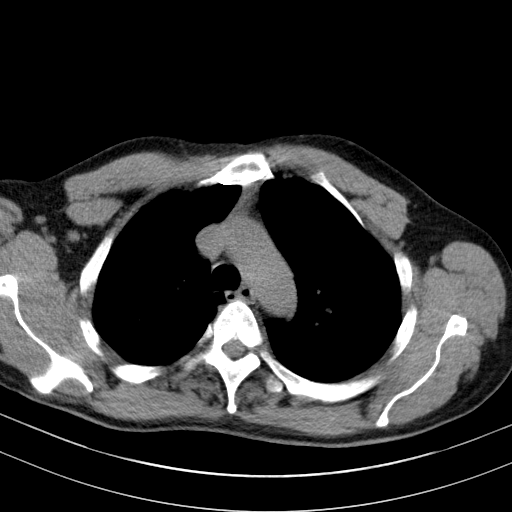
[im 45/61  lung]
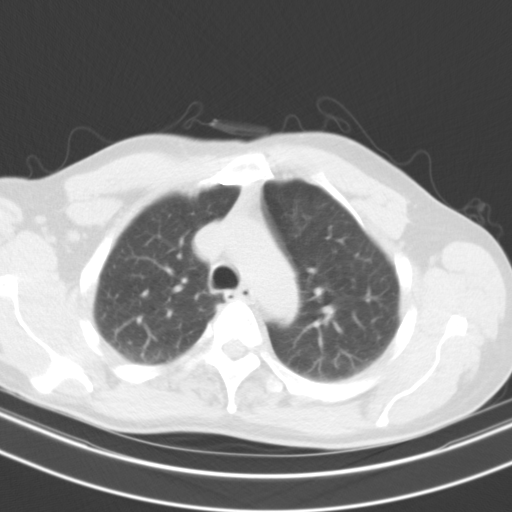
[im 49/61  lung]
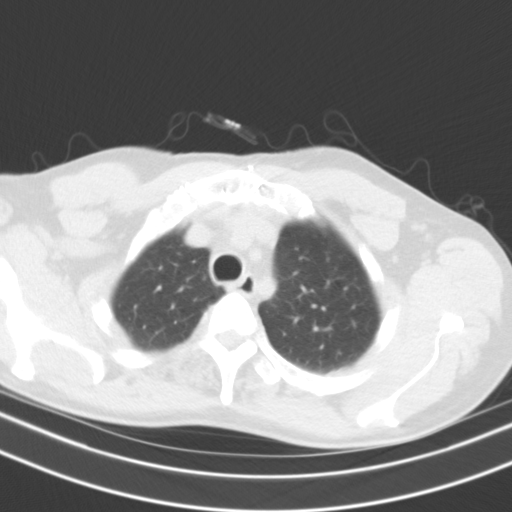
[im 54/61  lung]
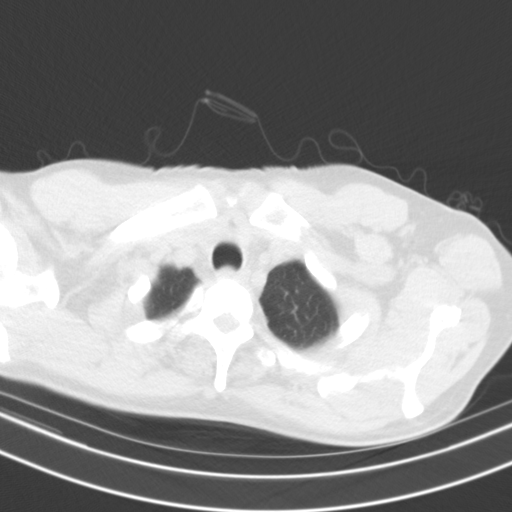
[im 58/61  lung]
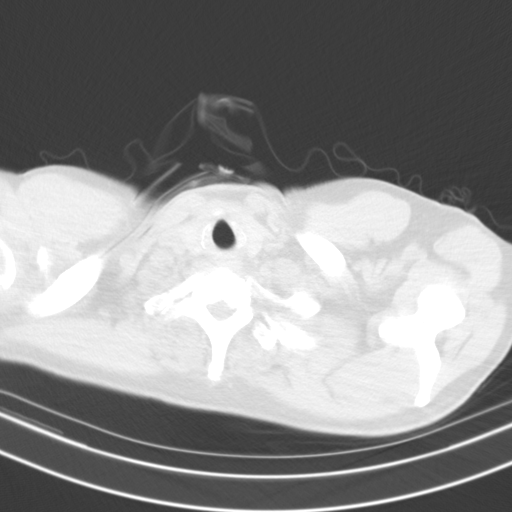

[16 of 31 positions shown; findings below may reference images not displayed]

FINDINGS: Cardiovascular: Mild motion degradation throughout. Normal aortic
caliber. Borderline cardiomegaly, without pericardial effusion.

Mediastinum/Nodes: No mediastinal or definite hilar adenopathy,
given limitations of unenhanced CT.

Lungs/Pleura: No pleural fluid.  Clear lungs.

Upper Abdomen: Normal imaged portions of the liver, spleen, stomach,
pancreas, gallbladder, adrenal glands, kidneys.

Musculoskeletal: Prior median sternotomy.
IMPRESSION: 1. Lung-RADS 1, negative. Continue annual screening with low-dose
chest CT without contrast in 12 months.
2. Mild motion degradation.

## 2023-05-06 ENCOUNTER — Other Ambulatory Visit: Payer: Self-pay | Admitting: Family

## 2023-05-06 DIAGNOSIS — Z136 Encounter for screening for cardiovascular disorders: Secondary | ICD-10-CM

## 2023-05-12 ENCOUNTER — Encounter: Payer: Self-pay | Admitting: Family

## 2023-05-12 ENCOUNTER — Other Ambulatory Visit: Payer: Self-pay

## 2023-05-12 ENCOUNTER — Ambulatory Visit (INDEPENDENT_AMBULATORY_CARE_PROVIDER_SITE_OTHER): Payer: Medicare Other | Admitting: Family

## 2023-05-12 VITALS — BP 154/92 | HR 67 | Temp 97.9°F | Ht 69.0 in | Wt 174.0 lb

## 2023-05-12 DIAGNOSIS — A539 Syphilis, unspecified: Secondary | ICD-10-CM | POA: Diagnosis not present

## 2023-05-12 MED ORDER — DOXYCYCLINE HYCLATE 100 MG PO TABS
100.0000 mg | ORAL_TABLET | Freq: Two times a day (BID) | ORAL | 0 refills | Status: DC
Start: 2023-05-12 — End: 2023-08-30

## 2023-05-12 NOTE — Assessment & Plan Note (Signed)
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 Treatment recommended secondary to possible non-reactive test previously but not confirmed. Unfortunately did not complete treatment and now with continued titer of 1:2. Discussed that he is likely serofast at 1:2 and treatment would unlikely change this. Following discussion will treat with Doxycycline 100 mg PO bid for 28 days. Reviewed the potential side effects of doxycycline. Will recheck RPR in about 4 months. Would not anticipate any additional treatment if titer remains at 1:2.

## 2023-05-12 NOTE — Patient Instructions (Signed)
Nice to see you.  Start taking medication twice daily.  Complete all medication.   Plan for follow up in 4 month or sooner if needed.   Continue to take your medication daily as prescribed.  Have a great day and stay safe!

## 2023-05-12 NOTE — Progress Notes (Signed)
Subjective:    Patient ID: Lawrence Robertson, male    DOB: [birth year], 67 y.o.   MRN: [id number]  Chief Complaint  Patient presents with   Syphilis     HPI:  Lawrence Robertson is a 67 y.o. male with previous medical of syphilis last seen by Dr. Thedore Mins on 08/21/21 with concern for possible new syphilis infection and presents today for follow up with concern for ongoing syphilis infection.   Mr. Jolley is present today with his sister who is his POA. Last time they saw Dr. Thedore Mins it was recommended to complete 3 weekly injections with Bicillin with concern for the possibility of a newly positive RPR testing. Unfortunately did not complete the injections and during recent testing was found to have a positive RPR titer of 1:2.   Previous Syphilis testing:  07/2000 - RPR 1:256 and treated for neurosyphilis with  14 days of penicillin.  03/2001 - RPR 1:64; no treatment needed 07/2021 - RPR 1.2 and treated with 1.2 units Bicillin    No current symptoms and is not currently sexually active. Denies fevers, chills, rashes, lesions, headaches, or changes in vision.     No Known Allergies    Outpatient Medications Prior to Visit  Medication Sig Dispense Refill   atorvastatin (LIPITOR) 40 MG tablet Take 1 tablet by mouth daily.     diclofenac Sodium (VOLTAREN) 1 % GEL Apply 4 g topically 4 (four) times daily.     triamcinolone ointment (KENALOG) 0.5 %      urea (CARMOL) 40 % CREA apply to the affected area(s) by topical route 2 times per day     fluticasone (FLONASE) 50 MCG/ACT nasal spray Place into both nostrils. (Patient not taking: Reported on 05/12/2023)     hydrochlorothiazide (HYDRODIURIL) 25 MG tablet  (Patient not taking: Reported on 08/21/2021)     simvastatin (ZOCOR) 20 MG tablet  (Patient not taking: Reported on 08/21/2021)     No facility-administered medications prior to visit.     Past Medical History:  Diagnosis Date   Hypercholesterolemia    Pain due to onychomycosis of  toenails of both feet      History reviewed. No pertinent surgical history.     Review of Systems  Constitutional:  Negative for appetite change, chills, fatigue, fever and unexpected weight change.  Eyes:  Negative for visual disturbance.  Respiratory:  Negative for cough, chest tightness, shortness of breath and wheezing.   Cardiovascular:  Negative for chest pain and leg swelling.  Gastrointestinal:  Negative for abdominal pain, constipation, diarrhea, nausea and vomiting.  Genitourinary:  Negative for dysuria, flank pain, frequency, genital sores, hematuria and urgency.  Skin:  Negative for rash.  Allergic/Immunologic: Negative for immunocompromised state.  Neurological:  Negative for dizziness and headaches.      Objective:    BP (!) 154/92   Pulse 67   Temp 97.9 F (36.6 C) (Temporal)   Ht 5\' 9"  (1.753 m)   Wt 174 lb (78.9 kg)   SpO2 98%   BMI 25.70 kg/m  Nursing note and vital signs reviewed.  Physical Exam Constitutional:      General: He is not in acute distress.    Appearance: He is well-developed.  Eyes:     Conjunctiva/sclera: Conjunctivae normal.  Cardiovascular:     Rate and Rhythm: Normal rate and regular rhythm.     Heart sounds: Normal heart sounds. No murmur heard.    No friction rub. No gallop.  Pulmonary:  Effort: Pulmonary effort is normal. No respiratory distress.     Breath sounds: Normal breath sounds. No wheezing or rales.  Chest:     Chest wall: No tenderness.  Abdominal:     General: Bowel sounds are normal.     Palpations: Abdomen is soft.     Tenderness: There is no abdominal tenderness.  Musculoskeletal:     Cervical back: Neck supple.  Lymphadenopathy:     Cervical: No cervical adenopathy.  Skin:    General: Skin is warm and dry.     Findings: No rash.  Neurological:     Mental Status: He is alert and oriented to person, place, and time.  Psychiatric:        Behavior: Behavior normal.        Thought Content: Thought  content normal.        Judgment: Judgment normal.         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 Matlock was successfully treated for neurosyphilis and had what I suspect is a serofast titer of 1:2 in October 2022. Treatment recommended secondary to possible non-reactive test previously but not confirmed. Unfortunately did not complete treatment and now with continued titer of 1:2. Discussed that he is likely serofast at 1:2 and treatment would unlikely change this. Following discussion will treat with Doxycycline 100 mg PO bid for 28 days. Reviewed the potential side effects of doxycycline. Will recheck RPR in about 4 months. Would not anticipate any additional treatment if titer remains at 1:2.       Relevant Medications   doxycycline (VIBRA-TABS) 100 MG tablet     I am having [name] start on doxycycline. I am also having him maintain his hydrochlorothiazide, simvastatin, triamcinolone ointment, diclofenac Sodium, atorvastatin, urea, and fluticasone.   Meds ordered this encounter  Medications   doxycycline (VIBRA-TABS) 100 MG tablet    Sig: Take 1 tablet (100 mg total) by mouth 2 (two) times daily.    Dispense:  56 tablet    Refill:  0    Order Specific Question:   Supervising Provider    Answer:   Judyann Munson [[id number]]     Follow-up: Return in about 4 months (around 09/12/2023), or if symptoms  worsen or fail to improve.   Marcos Eke, MSN, FNP-C Nurse Practitioner [facility] Main number: [phone]

## 2023-06-15 ENCOUNTER — Ambulatory Visit (INDEPENDENT_AMBULATORY_CARE_PROVIDER_SITE_OTHER): Payer: Medicare Other | Admitting: Podiatry

## 2023-06-15 ENCOUNTER — Encounter: Payer: Self-pay | Admitting: Podiatry

## 2023-06-15 DIAGNOSIS — M79674 Pain in right toe(s): Secondary | ICD-10-CM

## 2023-06-15 DIAGNOSIS — M79675 Pain in left toe(s): Secondary | ICD-10-CM | POA: Diagnosis not present

## 2023-06-15 DIAGNOSIS — B351 Tinea unguium: Secondary | ICD-10-CM | POA: Diagnosis not present

## 2023-06-23 NOTE — Progress Notes (Signed)
  Subjective:  Patient ID: Lawrence Robertson, male    DOB: Mar 06, 1956,  MRN: [id number]  [name] presents to clinic today for callus(es) right foot and painful thick toenails that are difficult to trim. Painful toenails interfere with ambulation. Aggravating factors include wearing enclosed shoe gear. Pain is relieved with periodic professional debridement. Painful calluses are aggravated when weightbearing with and without shoegear. Pain is relieved with periodic professional debridement.   He is accompanied by his sister, [place], on today's visit.  New problem(s): None.   PCP is Sherral Hammers, FNP.  No Known Allergies  Review of Systems: Negative except as noted in the HPI.  Objective: No changes noted in today's physical examination. There were no vitals filed for this visit. Lawrence Robertson is a pleasant 67 y.o. male WD, WN in NAD. AAO x 3.  Vascular Examination: Capillary refill time immediate b/l. Vascular status intact b/l with palpable pedal pulses. Pedal hair absent b/l. No pain with calf compression b/l. Skin temperature gradient WNL b/l. No cyanosis or clubbing b/l. No ischemia or gangrene noted b/l. No edema noted b/l LE.  Neurological Examination: Sensation grossly intact b/l with 10 gram monofilament. Vibratory sensation intact b/l.   Dermatological Examination: Pedal skin with normal turgor, texture and tone b/l.  No open wounds. No interdigital macerations.   Toenails 1-5 b/l thick, discolored, elongated with subungual debris and pain on dorsal palpation.   Hyperkeratotic lesion(s) right great toe.  No erythema, no edema, no drainage, no fluctuance.  Musculoskeletal Examination: Normal muscle strength 5/5 to all lower extremity muscle groups bilaterally. HAV with bunion deformity noted b/l LE. No pain, crepitus or joint limitation noted with ROM b/l LE.  Patient ambulates independently without assistive aids.  Radiographs:  None  Assessment/Plan: 1. Pain due to onychomycosis of toenails of both feet     -Patient's family member present. All questions/concerns addressed on today's visit. -Consent given for treatment as described below: -Examined patient. -Patient to continue soft, supportive shoe gear daily. -Toenails 1-5 b/l were debrided in length and girth with sterile nail nippers and dremel without iatrogenic bleeding.  -As a courtesy, callus(es) R hallux pared utilizing sterile scalpel blade without complication or incident. Total number pared=1. -Patient/POA to call should there be question/concern in the interim.   Return in about 3 months (around 09/15/2023).  Freddie Breech, DPM

## 2023-07-01 ENCOUNTER — Ambulatory Visit: Payer: Medicare Other

## 2023-08-30 ENCOUNTER — Ambulatory Visit (INDEPENDENT_AMBULATORY_CARE_PROVIDER_SITE_OTHER): Payer: Medicare Other | Admitting: Family

## 2023-08-30 ENCOUNTER — Encounter: Payer: Self-pay | Admitting: Family

## 2023-08-30 ENCOUNTER — Other Ambulatory Visit: Payer: Self-pay

## 2023-08-30 VITALS — BP 152/85 | HR 56 | Temp 97.7°F | Wt 164.0 lb

## 2023-08-30 DIAGNOSIS — A539 Syphilis, unspecified: Secondary | ICD-10-CM | POA: Diagnosis present

## 2023-08-30 NOTE — Assessment & Plan Note (Signed)
Lawrence Robertson has completed his 28 days of doxycycline as prescribed with no adverse side effects. As previously noted it is unlikely he had new infection and was serofast. Will recheck RPR today. Discussed plan of care and RPR testing that treatment would not be recommended unless titer increased to 1:8 or above as he has now been successfully treated. Follow up with ID as needed.

## 2023-08-30 NOTE — Progress Notes (Signed)
Subjective:    Patient ID: Lawrence Robertson, male    DOB: 12/02/1955, 67 y.o.   MRN: [id number]  Chief Complaint  Patient presents with   Follow-up    Syphillis    HPI:  Lawrence Robertson is a 67 y.o. male with previous history of syphilis last seen on 05/12/23 with positive RPR tier of 1:2 in October 2022 and previous treatment in October 2001 with titer of 1:256. 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 Here today for follow up.  Lawrence Robertson has been doing well since his last office visit and completed his course of doxycycline as prescribed with no adverse side effects or missed doses. No new concerns/complaints.    No Known Allergies    Outpatient Medications Prior to Visit  Medication Sig Dispense Refill   atorvastatin (LIPITOR) 40 MG tablet Take 1 tablet by mouth daily.     diclofenac Sodium (VOLTAREN) 1 % GEL Apply 4 g topically 4 (four) times daily.     triamcinolone ointment (KENALOG) 0.5 %      urea (CARMOL) 40 % CREA apply to the affected area(s) by topical route 2 times per day     doxycycline (VIBRA-TABS) 100 MG tablet Take 1 tablet (100 mg total) by mouth 2 (two) times daily. (Patient not taking: Reported on 08/30/2023) 56 tablet 0   fluticasone (FLONASE) 50 MCG/ACT nasal spray Place into both nostrils. (Patient not taking: Reported on 05/12/2023)     hydrochlorothiazide (HYDRODIURIL) 25 MG tablet  (Patient not taking: Reported on 08/21/2021)     simvastatin (ZOCOR) 20 MG tablet  (Patient not taking: Reported on 08/21/2021)     No facility-administered medications prior to visit.     Past Medical History:  Diagnosis Date   Hypercholesterolemia    Pain due to onychomycosis of toenails of both feet      History reviewed. No pertinent surgical history.     Review of Systems  Constitutional:  Negative for chills, diaphoresis, fatigue and fever.  Respiratory:  Negative for cough, chest tightness,  shortness of breath and wheezing.   Cardiovascular:  Negative for chest pain.  Gastrointestinal:  Negative for abdominal pain, diarrhea, nausea and vomiting.      Objective:    BP (!) 152/85   Pulse (!) 56   Temp 97.7 F (36.5 C) (Oral)   Wt 164 lb (74.4 kg)   SpO2 99%   BMI 24.22 kg/m  Nursing note and vital signs reviewed.  Physical Exam Constitutional:      General: He is not in acute distress.    Appearance: He is well-developed.  Cardiovascular:     Rate and Rhythm: Normal rate and regular rhythm.     Heart sounds: Normal heart sounds.  Pulmonary:     Effort: Pulmonary effort is normal.     Breath sounds: Normal breath sounds.  Skin:    General: Skin is warm and dry.  Neurological:     Mental Status: He is alert and oriented to person, place, and time.  Psychiatric:        Behavior: Behavior normal.        Thought Content: Thought content normal.        Judgment: Judgment normal.         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 As previously noted it is unlikely he had new infection and was serofast. Will recheck RPR today. Discussed plan of care and RPR testing that treatment would not be recommended unless titer increased to 1:8 or above as he has now been successfully treated. Follow up with ID as  needed.       Relevant Orders   RPR     I have discontinued Jeani Hawking. Riepe's hydrochlorothiazide, simvastatin, fluticasone, and doxycycline. I am also having him maintain his triamcinolone ointment, diclofenac Sodium, atorvastatin, and urea.   Follow-up:  As needed   Marcos Eke, MSN, FNP-C Nurse Practitioner [facility] for Infectious Disease [facility] Main number: [phone]

## 2023-08-30 NOTE — Patient Instructions (Signed)
Nice to see you.  We will check your lab work today.  Would not treated unless titer is 1:8 or higher.   Plan for follow up with ID as needed.   Have a great day and stay safe!

## 2023-09-01 LAB — T PALLIDUM AB: T Pallidum Abs: POSITIVE — AB

## 2023-09-01 LAB — RPR TITER: RPR Titer: 1:2 {titer} — ABNORMAL HIGH

## 2023-09-01 LAB — RPR: RPR Ser Ql: REACTIVE — AB

## 2023-09-05 ENCOUNTER — Telehealth: Payer: Self-pay

## 2023-09-05 NOTE — Telephone Encounter (Signed)
-----   Message from Jeanine Luz sent at 09/02/2023  3:18 PM EST ----- Please inform Lawrence Robertson that his RPR is unchanged following treatment. As discussed no additional treatment is needed at this point as he is likely serofast. Would not retreat unless titer increases to 1:8 or higher. No follow up with ID needed.

## 2023-09-05 NOTE — Telephone Encounter (Signed)
Left voicemail asking patient to return my call.   Billey Wojciak P Farrin Shadle, CMA  

## 2023-09-06 NOTE — Telephone Encounter (Signed)
Spoke with patient's sister, Lawrence Robertson [facility]) who accompanied Lawrence Robertson to his appointment, and relayed per Lawrence Eke, NP that RPR is stable and Lawrence Robertson does not require any further treatment at this time. Discussed that provider would only recommend retreating if titer reaches 1:8. Lawrence Robertson verbalized understanding.   Sandie Ano, RN

## 2023-09-14 ENCOUNTER — Encounter: Payer: Self-pay | Admitting: Podiatry

## 2023-09-14 ENCOUNTER — Ambulatory Visit (INDEPENDENT_AMBULATORY_CARE_PROVIDER_SITE_OTHER): Payer: Medicare Other | Admitting: Podiatry

## 2023-09-14 DIAGNOSIS — M79675 Pain in left toe(s): Secondary | ICD-10-CM | POA: Diagnosis not present

## 2023-09-14 DIAGNOSIS — B351 Tinea unguium: Secondary | ICD-10-CM

## 2023-09-14 DIAGNOSIS — M79674 Pain in right toe(s): Secondary | ICD-10-CM | POA: Diagnosis not present

## 2023-09-14 DIAGNOSIS — L84 Corns and callosities: Secondary | ICD-10-CM

## 2023-09-14 NOTE — Progress Notes (Signed)
  Subjective:  Patient ID: Lawrence Robertson, male    DOB: 1956/01/15,   MRN: [id number]  No chief complaint on file.   67 y.o. male presents for concern of thickened elongated and painful nails that are difficult to trim. Requesting to have them trimmed today.  PCP:  Sherral Hammers, FNP    . Denies any other pedal complaints. Denies n/v/f/c.   Past Medical History:  Diagnosis Date   Hypercholesterolemia    Pain due to onychomycosis of toenails of both feet     Objective:  Physical Exam: Vascular: DP/PT pulses 2/4 bilateral. CFT <3 seconds. Normal hair growth on digits. No edema.  Skin. No lacerations or abrasions bilateral feet.  Musculoskeletal: MMT 5/5 bilateral lower extremities in DF, PF, Inversion and Eversion. Deceased ROM in DF of ankle joint.  Neurological: Sensation intact to light touch.   Assessment:   1. Pain due to onychomycosis of toenails of both feet   2. Callus      Plan:  Patient was evaluated and treated and all questions answered. -Mechanically debrided all nails 1-5 bilateral using sterile nail nipper and filed with dremel without incident  -Hyperkeratotic tissue debrided as courtesy without incident.  -Answered all patient questions -Patient to return  in 3 months for at risk foot care -Patient advised to call the office if any problems or questions arise in the meantime.   Louann Sjogren, DPM

## 2023-11-11 ENCOUNTER — Ambulatory Visit
Admission: RE | Admit: 2023-11-11 | Discharge: 2023-11-11 | Disposition: A | Discharge status: Home / Self Care | Payer: 59 | Source: Ambulatory Visit | Attending: Family

## 2023-11-11 DIAGNOSIS — Z136 Encounter for screening for cardiovascular disorders: Secondary | ICD-10-CM

## 2023-12-20 ENCOUNTER — Ambulatory Visit (INDEPENDENT_AMBULATORY_CARE_PROVIDER_SITE_OTHER): Payer: 59 | Admitting: Podiatry

## 2023-12-20 ENCOUNTER — Encounter: Payer: Self-pay | Admitting: Podiatry

## 2023-12-20 VITALS — Ht 69.0 in | Wt 164.0 lb

## 2023-12-20 DIAGNOSIS — B351 Tinea unguium: Secondary | ICD-10-CM | POA: Diagnosis not present

## 2023-12-20 DIAGNOSIS — L84 Corns and callosities: Secondary | ICD-10-CM

## 2023-12-20 DIAGNOSIS — M79675 Pain in left toe(s): Secondary | ICD-10-CM | POA: Diagnosis not present

## 2023-12-20 DIAGNOSIS — M79674 Pain in right toe(s): Secondary | ICD-10-CM

## 2023-12-23 NOTE — Progress Notes (Signed)
  Subjective:  Patient ID: Lawrence Robertson, male    DOB: 1956/05/29,  MRN: [id number]  68 y.o. male presents callus(es) right foot and painful mycotic toenails that are difficult to trim. Painful toenails interfere with ambulation. Aggravating factors include wearing enclosed shoe gear. Pain is relieved with periodic professional debridement. Painful calluses are aggravated when weightbearing with and without shoegear. Pain is relieved with periodic professional debridement.  Chief Complaint  Patient presents with   Nail Problem    Pt is here for [facility] PCP is Dr Mayford Knife and LOV was in July.   New problem(s): None   PCP is Sherral Hammers, FNP.  No Known Allergies  Review of Systems: Negative except as noted in the HPI.   Objective:  Lawrence Robertson is a pleasant 68 y.o. male WD, WN in NAD. AAO x 3.  Vascular Examination: Capillary refill time immediate b/l. Vascular status intact b/l with palpable pedal pulses. Pedal hair absent b/l. No pain with calf compression b/l. Skin temperature gradient WNL b/l. No cyanosis or clubbing b/l. No ischemia or gangrene noted b/l. No edema noted b/l LE.  Neurological Examination: Sensation grossly intact b/l with 10 gram monofilament. Vibratory sensation intact b/l.  Dermatological Examination: Pedal skin with normal turgor, texture and tone b/l.  No open wounds. No interdigital macerations.   Toenails 1-5 b/l thick, discolored, elongated with subungual debris and pain on dorsal palpation.   Hyperkeratotic lesion(s) right great toe.  No erythema, no edema, no drainage, no fluctuance.  Musculoskeletal Examination: Normal muscle strength 5/5 to all lower extremity muscle groups bilaterally. HAV with bunion deformity noted b/l LE. No pain, crepitus or joint limitation noted with ROM b/l LE.  Patient ambulates independently without assistive aids.  Radiographs: None  Last A1c:       No data to display           Assessment:   1. Pain due to  onychomycosis of toenails of both feet   2. Callus    Plan:  -Patient was evaluated today. All questions/concerns addressed on today's visit. -Continue supportive shoe gear daily. -Mycotic toenails 1-5 bilaterally were debrided in length and girth with sterile nail nippers and dremel without incident. -Callus(es) right great toe pared utilizing sterile scalpel blade without complication or incident. Total number debrided =1. -Patient/POA to call should there be question/concern in the interim.  Return in about 4 months (around 04/18/2024).  Freddie Breech, DPM      [place] LOCATION: [address] [place] [address]                   Office [phone]   [facility] LOCATION: [address] [place], [place] [address] Office [phone]

## 2024-03-27 ENCOUNTER — Ambulatory Visit (INDEPENDENT_AMBULATORY_CARE_PROVIDER_SITE_OTHER): Payer: Medicare Other | Admitting: Podiatry

## 2024-03-27 ENCOUNTER — Encounter: Payer: Self-pay | Admitting: Podiatry

## 2024-03-27 DIAGNOSIS — B351 Tinea unguium: Secondary | ICD-10-CM

## 2024-03-27 DIAGNOSIS — M79674 Pain in right toe(s): Secondary | ICD-10-CM

## 2024-03-27 DIAGNOSIS — M79675 Pain in left toe(s): Secondary | ICD-10-CM | POA: Diagnosis not present

## 2024-04-01 NOTE — Progress Notes (Signed)
  Subjective:  Patient ID: Lawrence Robertson, male    DOB: [birth year],  MRN: [id number]  Lawrence Robertson presents to clinic today for callus(es) right foot and painful mycotic toenails that are difficult to trim. Painful toenails interfere with ambulation. Aggravating factors include wearing enclosed shoe gear. Pain is relieved with periodic professional debridement. Painful calluses are aggravated when weightbearing with and without shoegear. Pain is relieved with periodic professional debridement.  Chief Complaint  Patient presents with   Nail Problem    Sister stated, "Cut them toenails."   New problem(s): None.   PCP is Dillon Frames, FNP.  No Known Allergies  Review of Systems: Negative except as noted in the HPI.  Objective: No changes noted in today's physical examination. There were no vitals filed for this visit. Lawrence Robertson is a pleasant 68 y.o. male WD, WN in NAD. AAO x 3.  Vascular Examination: Capillary refill time immediate b/l. Vascular status intact b/l with palpable pedal pulses. Pedal hair absent b/l. No pain with calf compression b/l. Skin temperature gradient WNL b/l. No cyanosis or clubbing b/l. No ischemia or gangrene noted b/l. No edema noted b/l LE.  Neurological Examination: Sensation grossly intact b/l with 10 gram monofilament. Vibratory sensation intact b/l.   Dermatological Examination: Pedal skin with normal turgor, texture and tone b/l.  No open wounds. No interdigital macerations.   Toenails 1-5 b/l thick, discolored, elongated with subungual debris and pain on dorsal palpation.   Minimal hyperkeratos(is/es) noted R hallux.  Musculoskeletal Examination: Muscle strength 5/5 to all lower extremity muscle groups bilaterally. Hallux valgus with bunion deformity noted b/l lower extremities.  Radiographs: None  Assessment/Plan: 1. Pain due to onychomycosis of toenails of both feet    Consent given for treatment. Patient examined. All patient's  and/or POA's questions/concerns addressed on today's visit.Toenails 1-5 debrided in length and girth without incident. Continue soft, supportive shoe gear daily. Report any pedal injuries to medical professional. Call office if there are any questions/concerns. -Patient/POA to call should there be question/concern in the interim.   Return in about 3 months (around 06/27/2024).  Luella Sager, DPM      [place] LOCATION: [address] [place] [address]                   Office [phone]   [facility] LOCATION: [address] [place], [place] [address] Office [phone]

## 2024-07-03 ENCOUNTER — Ambulatory Visit (INDEPENDENT_AMBULATORY_CARE_PROVIDER_SITE_OTHER): Payer: 59 | Admitting: Podiatry

## 2024-07-03 DIAGNOSIS — M79675 Pain in left toe(s): Secondary | ICD-10-CM | POA: Diagnosis not present

## 2024-07-03 DIAGNOSIS — B351 Tinea unguium: Secondary | ICD-10-CM

## 2024-07-03 DIAGNOSIS — M79674 Pain in right toe(s): Secondary | ICD-10-CM

## 2024-07-08 ENCOUNTER — Encounter: Payer: Self-pay | Admitting: Podiatry

## 2024-07-08 NOTE — Progress Notes (Signed)
  Subjective:  Patient ID: Lawrence Robertson, male    DOB: September 05, 1956,  MRN: [id number]  Lawrence Robertson presents to clinic today for callus(es) right lower extremity and painful mycotic toenails that are difficult to trim. Painful toenails interfere with ambulation. Aggravating factors include wearing enclosed shoe gear. Pain is relieved with periodic professional debridement. Painful calluses are aggravated when weightbearing with and without shoegear. Pain is relieved with periodic professional debridement. He is accompanied by his sister on today's visit. Chief Complaint  Patient presents with   Nail Problem    Thick painful toenails, 3 month follow up    New problem(s): None.   PCP is Duwaine Annabella SAILOR, FNP.  No Known Allergies  Review of Systems: Negative except as noted in the HPI.  Objective: No changes noted in today's physical examination. There were no vitals filed for this visit. Lawrence Robertson is a pleasant 68 y.o. male WD, WN in NAD. AAO x 3.  Vascular Examination: Capillary refill time immediate b/l. Vascular status intact b/l with palpable pedal pulses. Pedal hair absent b/l. No pain with calf compression b/l. Skin temperature gradient WNL b/l. No cyanosis or clubbing b/l. No ischemia or gangrene noted b/l. No edema noted b/l LE.  Neurological Examination: Sensation grossly intact b/l with 10 gram monofilament. Vibratory sensation intact b/l.   Dermatological Examination: Pedal skin with normal turgor, texture and tone b/l.  No open wounds. No interdigital macerations.   Toenails 1-5 b/l thick, discolored, elongated with subungual debris and pain on dorsal palpation.   Hyperkeratos(is/es) noted R hallux IPJ  Musculoskeletal Examination: Muscle strength 5/5 to all lower extremity muscle groups bilaterally. Hallux valgus with bunion deformity noted b/l lower extremities.  Radiographs: None  Assessment/Plan: 1. Pain due to onychomycosis of toenails of both feet     Patient was evaluated and treated. All patient's and/or POA's questions/concerns addressed on today's visit. Mycotic toenails 1-5 debrided in length and girth without incident. Treatment was provided by assistant Mable FABIENE Cherry under my supervision. Callus(es) medial IPJ of right great toe pared with sharp debridement without incident by me. Continue soft, supportive shoe gear daily. Report any pedal injuries to medical professional. Call office if there are any questions/concerns.  Return in about 3 months (around 10/02/2024).  Delon LITTIE Merlin, DPM      [place] LOCATION: [address], [place] [address]                   Office [phone]   [facility] LOCATION: [address] [place], [place] [address] Office [phone]

## 2024-07-27 ENCOUNTER — Encounter: Payer: Self-pay | Admitting: Cardiology

## 2024-07-27 ENCOUNTER — Ambulatory Visit: Attending: Cardiology | Admitting: Cardiology

## 2024-07-27 VITALS — BP 126/88 | HR 64 | Resp 17 | Ht 69.0 in | Wt 169.0 lb

## 2024-07-27 DIAGNOSIS — I517 Cardiomegaly: Secondary | ICD-10-CM | POA: Diagnosis not present

## 2024-07-27 DIAGNOSIS — I10 Essential (primary) hypertension: Secondary | ICD-10-CM | POA: Insufficient documentation

## 2024-07-27 DIAGNOSIS — R030 Elevated blood-pressure reading, without diagnosis of hypertension: Secondary | ICD-10-CM

## 2024-07-27 DIAGNOSIS — E782 Mixed hyperlipidemia: Secondary | ICD-10-CM | POA: Diagnosis not present

## 2024-07-27 NOTE — Patient Instructions (Signed)
  Testing/Procedures: Echocardiogram  Your physician has requested that you have an echocardiogram. Echocardiography is a painless test that uses sound waves to create images of your heart. It provides your doctor with information about the size and shape of your heart and how well your heart's chambers and valves are working. This procedure takes approximately one hour. There are no restrictions for this procedure. Please do NOT wear cologne, perfume, aftershave, or lotions (deodorant is allowed). Please arrive 15 minutes prior to your appointment time.  Please note: We ask at that you not bring children with you during ultrasound (echo/ vascular) testing. Due to room size and safety concerns, children are not allowed in the ultrasound rooms during exams. Our front office staff cannot provide observation of children in our lobby area while testing is being conducted. An adult accompanying a patient to their appointment will only be allowed in the ultrasound room at the discretion of the ultrasound technician under special circumstances. We apologize for any inconvenience.   Follow-Up: At [facility], you and your health needs are our priority.  As part of our continuing mission to provide you with exceptional heart care, our providers are all part of one team.  This team includes your primary Cardiologist (physician) and Advanced Practice Providers or APPs (Physician Assistants and Nurse Practitioners) who all work together to provide you with the care you need, when you need it.  Your next appointment:   As needed   Provider:   Newman JINNY Lawrence, MD    We recommend signing up for the patient portal called MyChart.  Sign up information is provided on this After Visit Summary.  MyChart is used to connect with patients for Virtual Visits (Telemedicine).  Patients are able to view lab/test results, encounter notes, upcoming appointments, etc.  Non-urgent messages can be sent to your  provider as well.   To learn more about what you can do with MyChart, go to [email or web address].
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
## 2024-09-07 ENCOUNTER — Ambulatory Visit (HOSPITAL_COMMUNITY)
Admission: RE | Admit: 2024-09-07 | Discharge: 2024-09-07 | Disposition: A | Discharge status: Home / Self Care | Source: Ambulatory Visit | Attending: Cardiology | Admitting: Cardiology

## 2024-09-07 DIAGNOSIS — I517 Cardiomegaly: Secondary | ICD-10-CM

## 2024-09-07 LAB — ECHOCARDIOGRAM COMPLETE
AR max vel: 2.43 cm2
AV Area VTI: 2.14 cm2
AV Area mean vel: 2.18 cm2
AV Mean grad: 6 mmHg
AV Peak grad: 10.8 mmHg
Ao pk vel: 1.64 m/s
Area-P 1/2: 3.68 cm2
S' Lateral: 2.6 cm

## 2024-09-08 ENCOUNTER — Ambulatory Visit: Payer: Self-pay | Admitting: Cardiology

## 2024-09-08 NOTE — Progress Notes (Signed)
 Normal LV cavity size and LV thickness. No cardiomegaly. Heart function is low normal. Recommend blood pressure and cholesterol control. Continue statin/. BP was elevated at office visit, subsequently improved. Check BP at home regularly and let PCP know if BP consistently >130/80 mmHg.   Newman JINNY Lawrence, MD

## 2024-10-09 ENCOUNTER — Ambulatory Visit: Admitting: Podiatry

## 2024-10-09 ENCOUNTER — Encounter: Payer: Self-pay | Admitting: Podiatry

## 2024-10-09 DIAGNOSIS — B351 Tinea unguium: Secondary | ICD-10-CM

## 2024-10-09 DIAGNOSIS — L84 Corns and callosities: Secondary | ICD-10-CM

## 2024-10-09 DIAGNOSIS — M79675 Pain in left toe(s): Secondary | ICD-10-CM | POA: Diagnosis not present

## 2024-10-09 DIAGNOSIS — M79674 Pain in right toe(s): Secondary | ICD-10-CM | POA: Diagnosis not present

## 2024-10-14 ENCOUNTER — Encounter: Payer: Self-pay | Admitting: Podiatry

## 2024-10-14 NOTE — Progress Notes (Signed)
"  °  Subjective:  Patient ID: Lawrence Robertson, male    DOB: [birth year],  MRN: [id number]  Lawrence Robertson presents to clinic today for callus(es) right foot and painful mycotic toenails that are difficult to trim. Painful toenails interfere with ambulation. Aggravating factors include wearing enclosed shoe gear. Pain is relieved with periodic professional debridement. Painful calluses are aggravated when weightbearing with and without shoegear. Pain is relieved with periodic professional debridement.  Chief Complaint  Patient presents with   Nail Problem    Cut his toenails.   New problem(s): None.   PCP is Duwaine Annabella SAILOR, FNP.  Allergies[1]  Review of Systems: Negative except as noted in the HPI.  Objective: No changes noted in today's physical examination. There were no vitals filed for this visit. Lawrence Robertson is a pleasant 68 y.o. male WD, WN in NAD. AAO x 3.  Vascular Examination: Capillary refill time immediate b/l. Vascular status intact b/l with palpable pedal pulses. Pedal hair absent b/l. No pain with calf compression b/l. Skin temperature gradient WNL b/l. No cyanosis or clubbing b/l. No ischemia or gangrene noted b/l. No edema noted b/l LE.  Neurological Examination: Sensation grossly intact b/l with 10 gram monofilament. Vibratory sensation intact b/l.   Dermatological Examination: Pedal skin with normal turgor, texture and tone b/l.  No open wounds. No interdigital macerations.   Toenails 1-5 b/l thick, discolored, elongated with subungual debris and pain on dorsal palpation.   Hyperkeratos(is/es) noted R hallux IPJ.  Musculoskeletal Examination: Muscle strength 5/5 to all lower extremity muscle groups bilaterally. Hallux valgus with bunion deformity noted b/l lower extremities.  Radiographs: None  Assessment/Plan: 1. Pain due to onychomycosis of toenails of both feet    Consent given for treatment. Patient examined. All patient's and/or POA's questions/concerns  addressed on today's visit. Toenails 1-5 b/l debrided in length and girth without incident. Continue soft, supportive shoe gear daily. Report any pedal injuries to medical professional. Call office if there are any questions/concerns. -Patient/POA to call should there be question/concern in the interim.   Return in about 3 months (around 01/07/2025).  Lawrence Robertson, DPM       LOCATION: [address] [place] [address]                   Office [phone]   [facility] LOCATION: [address] [place], [place] [address] Office [phone]     [1] No Known Allergies  "

## 2025-01-23 ENCOUNTER — Ambulatory Visit: Admitting: Podiatry
